# Patient Record
Sex: Female | Born: 2000 | Race: White | Hispanic: No | Smoking: Former smoker
Health system: Southern US, Community
[De-identification: ages and names within clinical notes are randomized; demographics above are authoritative.]

## PROBLEM LIST (undated history)

## (undated) DIAGNOSIS — R569 Unspecified convulsions: Secondary | ICD-10-CM

## (undated) DIAGNOSIS — J45909 Unspecified asthma, uncomplicated: Secondary | ICD-10-CM

## (undated) HISTORY — PX: WISDOM TOOTH EXTRACTION: SHX21

---
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
## 2018-04-25 DIAGNOSIS — Z68.41 Body mass index (BMI) pediatric, 85th percentile to less than 95th percentile for age: Secondary | ICD-10-CM | POA: Diagnosis not present

## 2018-04-25 DIAGNOSIS — Z0001 Encounter for general adult medical examination with abnormal findings: Secondary | ICD-10-CM | POA: Diagnosis not present

## 2018-04-25 DIAGNOSIS — Z713 Dietary counseling and surveillance: Secondary | ICD-10-CM | POA: Diagnosis not present

## 2018-05-09 ENCOUNTER — Ambulatory Visit: Payer: 59 | Admitting: Child and Adolescent Psychiatry

## 2018-05-09 ENCOUNTER — Encounter: Payer: Self-pay | Admitting: Child and Adolescent Psychiatry

## 2018-05-09 ENCOUNTER — Other Ambulatory Visit: Payer: Self-pay

## 2018-05-09 DIAGNOSIS — R102 Pelvic and perineal pain: Secondary | ICD-10-CM | POA: Insufficient documentation

## 2018-05-09 DIAGNOSIS — F32A Depression, unspecified: Secondary | ICD-10-CM

## 2018-05-09 DIAGNOSIS — F329 Major depressive disorder, single episode, unspecified: Secondary | ICD-10-CM

## 2018-05-09 DIAGNOSIS — F1910 Other psychoactive substance abuse, uncomplicated: Secondary | ICD-10-CM | POA: Diagnosis not present

## 2018-05-09 DIAGNOSIS — F1914 Other psychoactive substance abuse with psychoactive substance-induced mood disorder: Secondary | ICD-10-CM | POA: Diagnosis not present
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
## 2018-05-10 ENCOUNTER — Encounter: Payer: Self-pay | Admitting: Child and Adolescent Psychiatry

## 2018-05-10 DIAGNOSIS — F32A Depression, unspecified: Secondary | ICD-10-CM | POA: Insufficient documentation

## 2018-05-10 DIAGNOSIS — F1910 Other psychoactive substance abuse, uncomplicated: Secondary | ICD-10-CM | POA: Insufficient documentation

## 2018-05-10 DIAGNOSIS — F329 Major depressive disorder, single episode, unspecified: Secondary | ICD-10-CM | POA: Insufficient documentation

## 2018-05-10 DIAGNOSIS — F1914 Other psychoactive substance abuse with psychoactive substance-induced mood disorder: Secondary | ICD-10-CM | POA: Insufficient documentation

## 2018-05-10 NOTE — Progress Notes (Signed)
Rhonda Walsh is a 17 y.o. female referred for psychiatric consultation and displays the following risk factors for Suicide:  Demographic factors:  Adolescent or young adult Current Mental Status: No plan to harm self or others Loss Factors: Financial problems/change in socioeconomic status Historical Factors: Prior suicide attempts Risk Reduction Factors: Employed, Living with another person, especially a relative and Positive social support  CLINICAL FACTORS:  Depression:   Insomnia Alcohol/Substance Abuse/Dependencies  COGNITIVE FEATURES THAT CONTRIBUTE TO RISK: Closed-mindedness    SUICIDE RISK:  Rhonda Walsh currently denies any SI/HI and does not appear in imminent danger to self/others. Her hx of previous SI, suicide attempt, depression, cutting, extensive substance abuse likely puts her at a chronically elevated risk of self-harm/harm to others. She appears future oriented, does have a good social support from adoptive mother and sees ind therapist weekly which would likely reduce chronic risk of self harm or harm to others. She along with her mother are highly encouraged to make an appointment with local substance abuse treatment programs from the list of the local substance abuse treatment program provided to them. This will be most beneficial to reduce chronic risk of self harm/harm to others.    Mental Status: As mentioned in H&P from today's visit.   PLAN OF CARE: As mentioned in H&P from today's visit and above in suicide risk.     Darcel Smalling, MD 05/10/2018, 4:08 PM

## 2018-06-18 ENCOUNTER — Encounter: Payer: Self-pay | Admitting: Emergency Medicine

## 2018-06-18 ENCOUNTER — Emergency Department: Payer: 59

## 2018-06-18 ENCOUNTER — Other Ambulatory Visit: Payer: Self-pay

## 2018-06-18 ENCOUNTER — Emergency Department
Admission: EM | Admit: 2018-06-18 | Discharge: 2018-06-18 | Disposition: A | Discharge status: Home / Self Care | Payer: 59 | Attending: Emergency Medicine | Admitting: Emergency Medicine

## 2018-06-18 DIAGNOSIS — J069 Acute upper respiratory infection, unspecified: Secondary | ICD-10-CM

## 2018-06-18 DIAGNOSIS — R05 Cough: Secondary | ICD-10-CM | POA: Diagnosis not present

## 2018-06-18 DIAGNOSIS — F17210 Nicotine dependence, cigarettes, uncomplicated: Secondary | ICD-10-CM | POA: Diagnosis not present

## 2018-06-18 DIAGNOSIS — J45901 Unspecified asthma with (acute) exacerbation: Secondary | ICD-10-CM | POA: Insufficient documentation

## 2018-06-18 DIAGNOSIS — Z79899 Other long term (current) drug therapy: Secondary | ICD-10-CM | POA: Diagnosis not present

## 2018-06-18 DIAGNOSIS — J4520 Mild intermittent asthma, uncomplicated: Secondary | ICD-10-CM

## 2018-06-18 DIAGNOSIS — R0981 Nasal congestion: Secondary | ICD-10-CM | POA: Diagnosis not present

## 2018-06-18 LAB — INFLUENZA PANEL BY PCR (TYPE A & B)
INFLBPCR: NEGATIVE
Influenza A By PCR: NEGATIVE

## 2018-06-18 MED ORDER — PREDNISONE 10 MG PO TABS: ORAL_TABLET | ORAL | 0 refills | Status: DC

## 2018-06-18 MED ORDER — METHYLPREDNISOLONE SODIUM SUCC 125 MG IJ SOLR
125.0000 mg | Freq: Once | INTRAMUSCULAR | Status: AC
  Administered 2018-06-18: 125 mg via INTRAMUSCULAR
  Filled 2018-06-18: qty 2

## 2018-06-18 MED ORDER — BENZONATATE 100 MG PO CAPS: 100.0000 mg | ORAL_CAPSULE | Freq: Three times a day (TID) | ORAL | 0 refills | Status: DC | PRN

## 2018-06-18 MED ORDER — ALBUTEROL SULFATE HFA 108 (90 BASE) MCG/ACT IN AERS: 2.0000 | INHALATION_SPRAY | Freq: Four times a day (QID) | RESPIRATORY_TRACT | 0 refills | Status: AC | PRN

## 2018-06-18 MED ORDER — IPRATROPIUM-ALBUTEROL 0.5-2.5 (3) MG/3ML IN SOLN
3.0000 mL | Freq: Once | RESPIRATORY_TRACT | Status: AC
  Administered 2018-06-18: 3 mL via RESPIRATORY_TRACT
  Filled 2018-06-18: qty 3

## 2018-06-18 MED ORDER — PSEUDOEPH-BROMPHEN-DM 30-2-10 MG/5ML PO SYRP: 5.0000 mL | ORAL_SOLUTION | Freq: Four times a day (QID) | ORAL | 0 refills | Status: DC | PRN

## 2018-06-18 MED ORDER — AZITHROMYCIN 250 MG PO TABS: ORAL_TABLET | ORAL | 0 refills | Status: DC

## 2018-06-18 MED ORDER — ONDANSETRON HCL 4 MG PO TABS: 4.0000 mg | ORAL_TABLET | Freq: Every day | ORAL | 0 refills | Status: DC | PRN

## 2018-06-18 MED ORDER — ONDANSETRON 4 MG PO TBDP
4.0000 mg | ORAL_TABLET | Freq: Once | ORAL | Status: AC
  Administered 2018-06-18: 4 mg via ORAL
  Filled 2018-06-18: qty 1

## 2018-06-18 NOTE — ED Triage Notes (Signed)
Patient reports nasal congestion and cough on and off for the past week. Denies fever at home. Patient complaining of mild nausea and a "pinch" across her chest when she coughs.

## 2018-06-18 NOTE — ED Provider Notes (Signed)
[facility] Emergency Department Provider Note  ____________________________________________  Time seen: Approximately 1:51 PM  I have reviewed the triage vital signs and the nursing notes.   HISTORY  Chief Complaint Nasal Congestion and Cough    HPI Rhonda Walsh is a 17 y.o. female that presents emergency department for evaluation of nasal congestion, sore throat, cough, shortness of breath with coughing, nausea for 1 week. She states that she has an occasional pinch to her left breast with coughing, when she presses the area and with certain movements.  Patient had 2 episodes of watery diarrhea last night.  She has an IUD.  Patient has a history of asthma.  She smokes 1-2 cigarettes per day. She smokes mariajuana. Patient does heavy lifting for work and may have pulled a muscle to her chest.  No fevers, chills, vomiting.    Past Medical History:  Diagnosis Date  . Seizures [facility])     Patient Active Problem List   Diagnosis Date Noted  . Polysubstance abuse (HCC) 05/10/2018  . 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 Depressive disorder 05/10/2018  . Pelvic pain in female 05/09/2018    Past Surgical History:  Procedure Laterality Date  . WISDOM TOOTH EXTRACTION      Prior to Admission medications   Medication Sig Start Date End Date Taking? Authorizing Provider  albuterol (PROVENTIL HFA;VENTOLIN HFA) 108 (90 Base) MCG/ACT inhaler Inhale 2 puffs into the lungs every 6 (six) hours as needed for wheezing or shortness of breath. 06/18/18   Enid Derry, PA-C  azithromycin (ZITHROMAX Z-PAK) 250 MG tablet Take 2 tablets (500 mg) on  Day 1,  followed by 1 tablet (250 mg) once daily on Days 2 through 5. 06/18/18   Enid Derry, PA-C  benzonatate (TESSALON PERLES) 100 MG capsule Take 1 capsule (100 mg total) by mouth 3 (three) times daily as needed for cough. 06/18/18 06/18/19  Enid Derry, PA-C  brompheniramine-pseudoephedrine-DM 30-2-10 MG/5ML syrup Take 5 mLs by mouth 4 (four) times daily as needed. 06/18/18   Enid Derry, PA-C  ondansetron (ZOFRAN) 4 MG tablet Take 1 tablet (4 mg total) by mouth daily as needed for nausea or vomiting. 06/18/18 06/18/19  Enid Derry, PA-C  predniSONE (DELTASONE) 10 MG tablet Take 6 tablets on day 1, take 5 tablets on day 2, take 4 tablets on day 3, take 3 tablets on day 4, take 2 tablets on day 5, take 1 tablet on day 6 06/18/18   Enid Derry, PA-C    Allergies Patient has no known allergies.  Family History  Adopted: Yes    Social History Social History   Tobacco Use  . Smoking status: Current Every Day Smoker    Types: Cigarettes, E-cigarettes  . Smokeless tobacco: Never Used  Substance Use Topics  . Alcohol use: Yes    Alcohol/week: 5.0 standard drinks    Types: 5 Shots of liquor per week  . Drug use: Yes    Types: Cocaine, Marijuana    Comment: percocet/ moley, marijuana everyday     Review of Systems  Constitutional: No fever/chills Eyes: No visual changes. No discharge. ENT: Positive for congestion and rhinorrhea. Respiratory: Positive for cough and SOB. Gastrointestinal: No abdominal pain.  No vomiting.  No diarrhea.  No constipation. Musculoskeletal: Negative for musculoskeletal pain. Skin: Negative for rash, abrasions, lacerations, ecchymosis. Neurological: Negative for headaches.   ____________________________________________   PHYSICAL EXAM:  VITAL SIGNS: ED Triage Vitals  Enc Vitals  Group     BP 06/18/18 1301 137/66     Pulse Rate 06/18/18 1301 83     Resp 06/18/18 1301 18     Temp 06/18/18 1301 98.4 F (36.9 C)     Temp Source 06/18/18 1301 Oral     SpO2 06/18/18 1301 100 %     Weight 06/18/18 1302 162 lb (73.5 kg)     Height 06/18/18 1302 5\' 5"  (1.651 m)     Head Circumference --      Peak Flow --      Pain Score 06/18/18 1301 7     Pain Loc --      Pain Edu? --      Excl.  in GC? --      Constitutional: Alert and oriented. Well appearing and in no acute distress. Eyes: Conjunctivae are normal. PERRL. EOMI. No discharge. Head: Atraumatic. ENT: No frontal and maxillary sinus tenderness.      Ears: Tympanic membranes pearly gray with good landmarks. No discharge.      Nose: Moderate congestion/rhinnorhea.      Mouth/Throat: Mucous membranes are moist. Oropharynx non-erythematous. Tonsils not enlarged. No exudates. Uvula midline. Neck: No stridor.   Hematological/Lymphatic/Immunilogical: No cervical lymphadenopathy. Cardiovascular: Normal rate, regular rhythm.  Good peripheral circulation. Respiratory: Normal respiratory effort without tachypnea or retractions. Lungs CTAB. Good air entry to the bases with no decreased or absent breath sounds. Gastrointestinal: Bowel sounds 4 quadrants. Soft and nontender to palpation. No guarding or rigidity. No palpable masses. No distention. Musculoskeletal: Full range of motion to all extremities. No gross deformities appreciated. Mild tenderness to palpation to medial left breast. Neurologic:  Normal speech and language. No gross focal neurologic deficits are appreciated.  Skin:  Skin is warm, dry and intact. No rash noted. Psychiatric: Mood and affect are normal. Speech and behavior are normal. Patient exhibits appropriate insight and judgement.   ____________________________________________   LABS (all labs ordered are listed, but only abnormal results are displayed)  Labs Reviewed  INFLUENZA PANEL BY PCR (TYPE A & B)   ____________________________________________  EKG  SR ____________________________________________  RADIOLOGY Lexine BatonI, Maurion Walkowiak, personally viewed and evaluated these images (plain radiographs) as part of my medical decision making, as well as reviewing the written report by the radiologist.  Dg Chest 2 View  Result Date: 06/18/2018 CLINICAL DATA:  nasal congestion, SOB, and dry cough on and  off for the past week. Denies fever at home. Patient complaining of mild nausea and a "pinch" across her chest when she coughs. No known heart or lung problems. Occasional smoker- reports 1-2 cigarettes per day. EXAM: CHEST - 2 VIEW COMPARISON:  01/18/2014 FINDINGS: Lungs are clear. Heart size and mediastinal contours are within normal limits. No effusion.  No pneumothorax. Visualized bones unremarkable. IMPRESSION: No acute cardiopulmonary disease. Electronically Signed   By: Corlis Leak  Hassell M.D.   On: 06/18/2018 14:17    ____________________________________________    PROCEDURES  Procedure(s) performed:    Procedures    Medications  methylPREDNISolone sodium succinate (SOLU-MEDROL) 125 mg/2 mL injection 125 mg (125 mg Intramuscular Given 06/18/18 1531)  ipratropium-albuterol (DUONEB) 0.5-2.5 (3) MG/3ML nebulizer solution 3 mL (3 mLs Nebulization Given 06/18/18 1530)  ondansetron (ZOFRAN-ODT) disintegrating tablet 4 mg (4 mg Oral Given 06/18/18 1529)     ____________________________________________   INITIAL IMPRESSION / ASSESSMENT AND PLAN / ED COURSE  Pertinent labs & imaging results that were available during my care of the patient were reviewed by me and considered in my  medical decision making (see chart for details).  Review of the [place] CSRS was performed in accordance of the [facility] prior to dispensing any controlled drugs.   Patient's diagnosis is consistent with URI with cough and congestion. Vital signs and exam are reassuring.  Chest x-ray negative for consolidation.  Influenza test is negative but patient has had symptoms for more than 48 hours so may be inaccurate.  Symptoms have been present for 1 week.  Patient appears well and is staying well hydrated.  Patient feels comfortable going home. Patient will be discharged home with prescriptions for azithromycin, prednisone, Tessalon Perles, Bromfed, albuterol inhaler, Zofran. Patient is to follow up with primary care as needed or  otherwise directed. Patient is given ED precautions to return to the ED for any worsening or new symptoms.     ____________________________________________  FINAL CLINICAL IMPRESSION(S) / ED DIAGNOSES  Final diagnoses:  Intermittent asthma, unspecified asthma severity, unspecified whether complicated  URI with cough and congestion      NEW MEDICATIONS STARTED DURING THIS VISIT:  ED Discharge Orders         Ordered    brompheniramine-pseudoephedrine-DM 30-2-10 MG/5ML syrup  4 times daily PRN     06/18/18 1533    benzonatate (TESSALON PERLES) 100 MG capsule  3 times daily PRN     06/18/18 1533    albuterol (PROVENTIL HFA;VENTOLIN HFA) 108 (90 Base) MCG/ACT inhaler  Every 6 hours PRN     06/18/18 1533    predniSONE (DELTASONE) 10 MG tablet     06/18/18 1533    azithromycin (ZITHROMAX Z-PAK) 250 MG tablet     06/18/18 1636    ondansetron (ZOFRAN) 4 MG tablet  Daily PRN     06/18/18 1651              This chart was dictated using voice recognition software/Dragon. Despite best efforts to proofread, errors can occur which can change the meaning. Any change was purely unintentional.    Enid Derry, PA-C 06/18/18 1830    Emily Filbert, MD 06/21/18 1525

## 2018-07-10 ENCOUNTER — Other Ambulatory Visit: Payer: Self-pay

## 2018-07-10 ENCOUNTER — Encounter: Payer: Self-pay | Admitting: Emergency Medicine

## 2018-07-10 ENCOUNTER — Emergency Department
Admission: EM | Admit: 2018-07-10 | Discharge: 2018-07-10 | Disposition: A | Discharge status: Home / Self Care | Payer: 59 | Attending: Emergency Medicine | Admitting: Emergency Medicine

## 2018-07-10 ENCOUNTER — Emergency Department: Payer: 59

## 2018-07-10 DIAGNOSIS — Y929 Unspecified place or not applicable: Secondary | ICD-10-CM | POA: Insufficient documentation

## 2018-07-10 DIAGNOSIS — Y990 Civilian activity done for income or pay: Secondary | ICD-10-CM | POA: Insufficient documentation

## 2018-07-10 DIAGNOSIS — S99912A Unspecified injury of left ankle, initial encounter: Secondary | ICD-10-CM | POA: Diagnosis present

## 2018-07-10 DIAGNOSIS — Y9389 Activity, other specified: Secondary | ICD-10-CM | POA: Insufficient documentation

## 2018-07-10 DIAGNOSIS — W010XXA Fall on same level from slipping, tripping and stumbling without subsequent striking against object, initial encounter: Secondary | ICD-10-CM | POA: Insufficient documentation

## 2018-07-10 DIAGNOSIS — M7989 Other specified soft tissue disorders: Secondary | ICD-10-CM | POA: Diagnosis not present

## 2018-07-10 DIAGNOSIS — S93492A Sprain of other ligament of left ankle, initial encounter: Secondary | ICD-10-CM | POA: Diagnosis not present

## 2018-07-10 DIAGNOSIS — F329 Major depressive disorder, single episode, unspecified: Secondary | ICD-10-CM | POA: Insufficient documentation

## 2018-07-10 DIAGNOSIS — Z87891 Personal history of nicotine dependence: Secondary | ICD-10-CM | POA: Diagnosis not present

## 2018-07-10 DIAGNOSIS — M25572 Pain in left ankle and joints of left foot: Secondary | ICD-10-CM | POA: Diagnosis not present

## 2018-07-10 MED ORDER — NAPROXEN 500 MG PO TABS: 500.0000 mg | ORAL_TABLET | Freq: Two times a day (BID) | ORAL | 0 refills | Status: AC

## 2018-07-10 MED ORDER — NAPROXEN 500 MG PO TABS
500.0000 mg | ORAL_TABLET | Freq: Once | ORAL | Status: AC
  Administered 2018-07-10: 500 mg via ORAL
  Filled 2018-07-10: qty 1

## 2018-07-10 NOTE — ED Notes (Signed)
Pt has left ankle pain.  Pt twisted ankle today at work.   Pt did not fall   Swelling to ankle noted.   Pt alert.

## 2018-07-10 NOTE — Discharge Instructions (Signed)
Rest, ice, and elevate your foot as often as possible.  Follow up with the podiatrist if not improving over the next few days.  Return to the ER for symptoms that change or worsen if unable to schedule an appointment.

## 2018-07-10 NOTE — ED Provider Notes (Signed)
[facility] Emergency Department Provider Note ____________________________________________  Time seen: Approximately 8:14 PM  I have reviewed the triage vital signs and the nursing notes.   HISTORY  Chief Complaint Ankle Pain    HPI Art BuffKendall R Rodd is a 18 y.o. female who presents to the emergency department for evaluation and treatment of left ankle pain.  While at work today, she tripped over some boxes and twisted her ankle. Incident occurred at about 2:00PM, but she continued to work. Once she was off, she took her boot off and the pain intensified. She has not taken anything or attempted any alleviating measures prior to arrival.   Past Medical History:  Diagnosis Date  . Seizures [facility](HCC)     Patient Active Problem List   Diagnosis Date Noted  . Polysubstance abuse (HCC) 05/10/2018  . 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 Depressive disorder 05/10/2018  . Pelvic pain in female 05/09/2018    Past Surgical History:  Procedure Laterality Date  . WISDOM TOOTH EXTRACTION      Prior to Admission medications   Medication Sig Start Date End Date Taking? Authorizing Provider  albuterol (PROVENTIL HFA;VENTOLIN HFA) 108 (90 Base) MCG/ACT inhaler Inhale 2 puffs into the lungs every 6 (six) hours as needed for wheezing or shortness of breath. 06/18/18   Enid DerryWagner, Ashley, PA-C  azithromycin (ZITHROMAX Z-PAK) 250 MG tablet Take 2 tablets (500 mg) on  Day 1,  followed by 1 tablet (250 mg) once daily on Days 2 through 5. 06/18/18   Enid DerryWagner, Ashley, PA-C  benzonatate (TESSALON PERLES) 100 MG capsule Take 1 capsule (100 mg total) by mouth 3 (three) times daily as needed for cough. 06/18/18 06/18/19  Enid DerryWagner, Ashley, PA-C  brompheniramine-pseudoephedrine-DM 30-2-10 MG/5ML syrup Take 5 mLs by mouth 4 (four) times daily as needed. 06/18/18   Enid DerryWagner, Ashley, PA-C  naproxen (NAPROSYN) 500 MG tablet Take 1  tablet (500 mg total) by mouth 2 (two) times daily with a meal. 07/10/18   Kamaiya Antilla B, FNP  ondansetron (ZOFRAN) 4 MG tablet Take 1 tablet (4 mg total) by mouth daily as needed for nausea or vomiting. 06/18/18 06/18/19  Enid DerryWagner, Ashley, PA-C  predniSONE (DELTASONE) 10 MG tablet Take 6 tablets on day 1, take 5 tablets on day 2, take 4 tablets on day 3, take 3 tablets on day 4, take 2 tablets on day 5, take 1 tablet on day 6 06/18/18   Enid DerryWagner, Ashley, PA-C    Allergies Patient has no known allergies.  Family History  Adopted: Yes    Social History Social History   Tobacco Use  . Smoking status: Former Smoker    Types: Cigarettes, E-cigarettes    Last attempt to quit: 04/01/2018    Years since quitting: 0.2  . Smokeless tobacco: Never Used  Substance Use Topics  . Alcohol use: Yes    Alcohol/week: 5.0 standard drinks    Types: 5 Shots of liquor per week  . Drug use: Yes    Types: Cocaine, Marijuana    Comment: percocet/ moley, marijuana everyday    Review of Systems Constitutional: Negative for fever. Cardiovascular: Negative for chest pain. Respiratory: Negative for shortness of breath. Musculoskeletal: Positive for left ankle pain and swelling Skin: Negative for open wound or lesion.  Neurological: Negative for decrease in sensation  ____________________________________________   PHYSICAL EXAM:  VITAL SIGNS: ED Triage Vitals [07/10/18 1906]  Enc Vitals Group     BP 128/66  Pulse Rate 82     Resp 14     Temp 98.7 F (37.1 C)     Temp Source Oral     SpO2 100 %     Weight 150 lb (68 kg)     Height 5\' 5"  (1.651 m)     Head Circumference      Peak Flow      Pain Score 10     Pain Loc      Pain Edu?      Excl. in GC?     Constitutional: Alert and oriented. Well appearing and in no acute distress. Eyes: Conjunctivae are clear without discharge or drainage Head: Atraumatic Neck: Supple Respiratory: No cough. Respirations are even and  unlabored. Musculoskeletal: ATFL pattern tenderness over the left ankle.  No tenderness over the proximal fibula.  Limited flexion, extension, inversion, and eversion secondary to pain and swelling. Neurologic: Motor and sensory function is intact. Skin: Moderate edema over the left lateral malleolus without deformity.  Skin is warm and dry.  Pulses 2+ DP and PT Psychiatric: Affect and behavior are appropriate.  ____________________________________________   LABS (all labs ordered are listed, but only abnormal results are displayed)  Labs Reviewed - No data to display ____________________________________________  RADIOLOGY  Image of the left ankle reviewed by me.  No acute bony abnormality. ____________________________________________   PROCEDURES  .Splint Application Date/Time: 07/10/2018 8:27 PM Performed by: Ainsley Spinner, NT Authorized by: Chinita Pester, FNP   Consent:    Consent obtained:  Verbal   Consent given by:  Patient   Risks discussed:  Discoloration, pain and swelling Pre-procedure details:    Sensation:  Normal Procedure details:    Laterality:  Left   Location:  Ankle   Splint type:  Ankle stirrup   Supplies:  Prefabricated splint Post-procedure details:    Pain:  Improved   Sensation:  Normal   Patient tolerance of procedure:  Tolerated well, no immediate complications  ____________________________________________   INITIAL IMPRESSION / ASSESSMENT AND PLAN / ED COURSE  DELMIRA GRIFFITHS is a 18 y.o. who presents to the emergency department for treatment evaluation of left ankle pain after an inversion injury while at work.  X-ray and exam are consistent with ankle sprain.  She was placed in an ankle stirrup splint and given crutches. She was advised to discuss filing worker's compensation with her supervisor.   Patient instructed to follow-up with podiatry if not improving over the week.  She was also instructed to return to the emergency  department for symptoms that change or worsen if unable schedule an appointment with orthopedics or primary care.  Medications  naproxen (NAPROSYN) tablet 500 mg (has no administration in time range)    Pertinent labs & imaging results that were available during my care of the patient were reviewed by me and considered in my medical decision making (see chart for details).  _________________________________________   FINAL CLINICAL IMPRESSION(S) / ED DIAGNOSES  Final diagnoses:  Sprain of anterior talofibular ligament of left ankle, initial encounter    ED Discharge Orders         Ordered    naproxen (NAPROSYN) 500 MG tablet  2 times daily with meals     07/10/18 2047           If controlled substance prescribed during this visit, 12 month history viewed on the [facility] prior to issuing an initial prescription for Schedule II or III opiod.    Malin Sambrano B,  FNP 07/10/18 2120    Minna AntisPaduchowski, Kevin, MD 07/10/18 2315

## 2018-07-10 NOTE — ED Triage Notes (Signed)
Pt presents to ED with c/o left ankle pain. Pt states she works for [employer] and tripped over boxes. Swelling noted. Pt reports she is unable to make this a workmans comp case due to not having the job long enough to qualify. +painful with ambulation and touch.

## 2018-11-29 ENCOUNTER — Other Ambulatory Visit: Payer: Self-pay | Admitting: Obstetrics & Gynecology

## 2019-02-09 ENCOUNTER — Emergency Department
Admission: EM | Admit: 2019-02-09 | Discharge: 2019-02-09 | Disposition: A | Discharge status: Home / Self Care | Payer: 59 | Attending: Emergency Medicine | Admitting: Emergency Medicine

## 2019-02-09 ENCOUNTER — Other Ambulatory Visit: Payer: Self-pay

## 2019-02-09 ENCOUNTER — Encounter: Payer: Self-pay | Admitting: Emergency Medicine

## 2019-02-09 DIAGNOSIS — U071 COVID-19: Secondary | ICD-10-CM | POA: Insufficient documentation

## 2019-02-09 DIAGNOSIS — Z036 Encounter for observation for suspected toxic effect from ingested substance ruled out: Secondary | ICD-10-CM | POA: Diagnosis present

## 2019-02-09 DIAGNOSIS — F1914 Other psychoactive substance abuse with psychoactive substance-induced mood disorder: Secondary | ICD-10-CM | POA: Diagnosis not present

## 2019-02-09 DIAGNOSIS — Z046 Encounter for general psychiatric examination, requested by authority: Secondary | ICD-10-CM | POA: Diagnosis not present

## 2019-02-09 DIAGNOSIS — J45909 Unspecified asthma, uncomplicated: Secondary | ICD-10-CM | POA: Diagnosis not present

## 2019-02-09 DIAGNOSIS — Z87891 Personal history of nicotine dependence: Secondary | ICD-10-CM | POA: Diagnosis not present

## 2019-02-09 DIAGNOSIS — F419 Anxiety disorder, unspecified: Secondary | ICD-10-CM | POA: Insufficient documentation

## 2019-02-09 DIAGNOSIS — R451 Restlessness and agitation: Secondary | ICD-10-CM | POA: Insufficient documentation

## 2019-02-09 HISTORY — DX: Unspecified asthma, uncomplicated: J45.909

## 2019-02-09 LAB — CBC
HCT: 45.6 % (ref 36.0–46.0)
Hemoglobin: 15.3 g/dL — ABNORMAL HIGH (ref 12.0–15.0)
MCH: 31 pg (ref 26.0–34.0)
MCHC: 33.6 g/dL (ref 30.0–36.0)
MCV: 92.5 fL (ref 80.0–100.0)
Platelets: 296 10*3/uL (ref 150–400)
RBC: 4.93 MIL/uL (ref 3.87–5.11)
RDW: 14.8 % (ref 11.5–15.5)
WBC: 8.7 10*3/uL (ref 4.0–10.5)
nRBC: 0 % (ref 0.0–0.2)

## 2019-02-09 LAB — COMPREHENSIVE METABOLIC PANEL
ALT: 21 U/L (ref 0–44)
AST: 41 U/L (ref 15–41)
Albumin: 5 g/dL (ref 3.5–5.0)
Alkaline Phosphatase: 71 U/L (ref 38–126)
Anion gap: 13 (ref 5–15)
BUN: 7 mg/dL (ref 6–20)
CO2: 23 mmol/L (ref 22–32)
Calcium: 10.2 mg/dL (ref 8.9–10.3)
Chloride: 100 mmol/L (ref 98–111)
Creatinine, Ser: 0.73 mg/dL (ref 0.44–1.00)
GFR calc Af Amer: >60 mL/min (ref >60)
GFR calc non Af Amer: >60 mL/min (ref >60)
Glucose, Bld: 103 mg/dL — ABNORMAL HIGH (ref 70–99)
Potassium: 4 mmol/L (ref 3.5–5.1)
Sodium: 136 mmol/L (ref 135–145)
Total Bilirubin: 1.3 mg/dL — ABNORMAL HIGH (ref 0.3–1.2)
Total Protein: 9.1 g/dL — ABNORMAL HIGH (ref 6.5–8.1)

## 2019-02-09 LAB — URINE DRUG SCREEN, QUALITATIVE (ARMC ONLY)
Amphetamines, Ur Screen: POSITIVE — AB
Barbiturates, Ur Screen: NOT DETECTED
Benzodiazepine, Ur Scrn: NOT DETECTED
Cannabinoid 50 Ng, Ur ~~LOC~~: POSITIVE — AB
Cocaine Metabolite,Ur ~~LOC~~: POSITIVE — AB
MDMA (Ecstasy)Ur Screen: NOT DETECTED
Methadone Scn, Ur: NOT DETECTED
Opiate, Ur Screen: NOT DETECTED
Phencyclidine (PCP) Ur S: NOT DETECTED
Tricyclic, Ur Screen: NOT DETECTED

## 2019-02-09 LAB — ETHANOL: Alcohol, Ethyl (B): <10 mg/dL (ref <10)

## 2019-02-09 LAB — PREGNANCY, URINE: Preg Test, Ur: NEGATIVE

## 2019-02-09 LAB — SARS CORONAVIRUS 2 BY RT PCR (HOSPITAL ORDER, PERFORMED IN ~~LOC~~ HOSPITAL LAB): SARS Coronavirus 2: POSITIVE — AB

## 2019-02-09 MED ORDER — HALOPERIDOL LACTATE 5 MG/ML IJ SOLN
5.0000 mg | Freq: Once | INTRAMUSCULAR | Status: AC
  Administered 2019-02-09: 5 mg via INTRAMUSCULAR
  Filled 2019-02-09: qty 1

## 2019-02-09 MED ORDER — NICOTINE 21 MG/24HR TD PT24
21.0000 mg | MEDICATED_PATCH | Freq: Once | TRANSDERMAL | Status: DC
  Administered 2019-02-09: 21 mg via TRANSDERMAL
  Filled 2019-02-09: qty 1

## 2019-02-09 MED ORDER — GABAPENTIN 300 MG PO CAPS: 300.0000 mg | ORAL_CAPSULE | Freq: Three times a day (TID) | ORAL | 2 refills | Status: AC

## 2019-02-09 MED ORDER — GABAPENTIN 300 MG PO CAPS: 300.0000 mg | ORAL_CAPSULE | Freq: Three times a day (TID) | ORAL | 1 refills | Status: DC

## 2019-02-09 MED ORDER — DIAZEPAM 5 MG PO TABS
10.0000 mg | ORAL_TABLET | Freq: Once | ORAL | Status: AC
  Administered 2019-02-09: 10 mg via ORAL
  Filled 2019-02-09: qty 2

## 2019-02-09 MED ORDER — GABAPENTIN 300 MG PO CAPS
300.0000 mg | ORAL_CAPSULE | Freq: Three times a day (TID) | ORAL | Status: DC
  Administered 2019-02-09: 15:00:00 300 mg via ORAL
  Filled 2019-02-09: qty 1

## 2019-02-09 NOTE — ED Notes (Signed)
Pt walking around in room, crying, states " I don't feel safe, they are trying to hurt me" PT refers to " theres people in this room" RN gave verbal reassurance of pt safety. NT called to sit with pt

## 2019-02-09 NOTE — Progress Notes (Signed)
Patient too groggy to assess related to PRN medications on admission.  Waylan Boga, PMHNP

## 2019-02-09 NOTE — Discharge Instructions (Signed)
[facility] & [facility] 1.8 33 [employer] reviews Mental health service [place] Address: [address], [place], [place] [address] Hours:  8 am to 5 pm, Monday - Friday Phone: [phone]  Substance Use Disorder and Mental Illness Substance use disorder is a condition in which a person is dependent on a substance, such as drugs or alcohol. A mental illness is a condition that occurs when someone experiences changes in mood, behavior, or thinking. Sometimes, these two conditions can occur at the same time (co-occurring disorders) and may be diagnosed together (dual diagnosis). What is the relationship between substance use disorder and mental illness? Substance use disorder and mental illness can share symptoms and can have similar causes, such as exposure to stress or changes in brain chemicals. The risk for developing both of these conditions can be passed from parent to child (inherited). People with mental illnesses sometimes use drugs to try and relieve symptoms, and this can lead to substance use disorder. Substance use disorders occur more often in people who have depression, schizophrenia, anxiety, or personality disorders. When some drugs are used regularly, they can cause people to have symptoms of mental illness. What are the signs or symptoms? Symptoms vary widely for substance use disorder and mental illness, especially because these conditions can be present at the same time. Signs of a substance use disorder may include:  Failure to meet responsibilities at home, work, or school.  Using substances in risky situation, such as while driving or using machinery.  Taking serious risks to get drugs or alcohol.  Spending less time on activities or hobbies that used to be important.  Changes in personality or attitude for no reason, such as angry  outbursts, symptoms of anxiety, or unusual giddiness.  Trying to hide the amount of drugs or alcohol used.  Increased substance use over time, or needing to use more of a substance to feel the same effects (developing a tolerance).  Sudden weight loss or gain.  Sleeping too much or too little.  Uncontrolled trembling or shaking (tremors), slurred speech, or lack of coordination.  Continuing to use alcohol or a drug even though using it has led to bad outcomes or consequences, such as losing a job or ending a relationship. Signs of mental illness may include:  Extreme mood changes (mood swings).  Sleeping too much or too little.  Weight loss or weight gain.  Being easily distracted.  Confused thinking or trouble concentrating.  Expressing thoughts of suicide.  Withdrawing from friends and family, or sudden changes in social behaviors or hobbies.  Aggression toward people and animals.  Repeatedly breaking serious rules or breaking the law.  Having persistent thoughts or urges that are unpleasant or feel out of control (involuntary). The person may feel the need to act on the urges in order to reduce anxiety.  Persistent feelings of sadness or hopelessness.  False beliefs (delusions).  Seeing, hearing, tasting, smelling, or feeling things that are not real (hallucinations). How is this diagnosed? Substance use disorder and mental illnesses can be difficult to diagnose at the same time because of how varied and complex the symptoms are. Because these conditions can interact, one condition may be missed. This is why it is important to be completely honest with your health care provider about substance use and your other symptoms. Diagnosing your condition may include:  A physical exam.  A review  of your medical history and your symptoms. Your health care provider may refer you to a mental health professional for a psychiatric evaluation. This may include assessments  of:  Your use of substances.  Your risk of suicide.  Your risk of aggressive behaviors.  Your lifestyle, environment, and social situations.  Your medical health.  Your mental health and behavioral history. How is this treated? It is best to treat substance use disorder and mental illness at the same time (integrated treatment approach). Treatment usually involves more than one of the following methods:  Detox. This refers to stopping substance abuse while being monitored by trained medical staff. This is usually the first step in treatment. Detox can last for up to 7 days.  Rehabilitation. This involves staying in a treatment center where you can have medical and mental health support all the time.  Medicines to relieve symptoms of mental illness and to control symptoms that are caused by stopping substance abuse (withdrawal symptoms).  Support groups. These groups encourage you to talk about your fears, frustrations, and anxieties with others who have the same condition.  Talk therapy. This is one-on-one therapy that can help you learn about your illness and learn ways to cope with symptoms or side effects. Follow these instructions at home: Lifestyle  Exercise regularly. Aim for 150 minutes of moderate exercise (such as walking or biking) or 75 minutes of vigorous exercise (such as running) each week.  Eat a healthy diet with plenty of fruits and vegetables, whole grains, and lean proteins.  Avoid caffeine and tobacco. These can worsen symptoms and anxiety.  Do not drink alcohol or use drugs.  Try to get 7-9 hours of sleep each night. To do this: ? Keep your bedroom cool and dark. ? Do not eat a heavy meal during the hour before you go to bed. ? Do not have caffeine before bedtime. ? Avoid screen time during the few hours before bedtime. This means not watching TV and not using a computer, cell phone, or tablet. Medicines  Take over-the-counter and prescription medicines  only as told by your health care provider.  Do not stop taking medicines unless you ask your health care provider if it is safe to do that.  Tell your health care provider about any medicine side effects that you experience. General instructions  Follow your treatment plan as directed. Work with your health care provider to adjust your treatment plan as needed.  Attend support group or therapy sessions as directed.  Explain your diagnosis to your friends and family. Let them know what your symptoms are and what things cause your symptoms to start (triggers).  Avoid triggers or stressors that may worsen your symptoms or cause you to use alcohol or drugs. Spend time with family and friends who do not use substances.  Make time to relax and do self-soothing activities, such as meditating or listening to music.  Keep all follow-up visits as told by your health care provider and therapist. This is important. Where to find support You may find support for coping with substance use disorder and mental illness from:  Your health care providers or your therapist. These providers can treat you or they can help you find services to treat your condition.  Local support groups for people with your condition. Your health care provider or therapist may be able to recommend a support group. This may be a hospital support group, a [employer] on Mental Illness (NAMI) support group, or a 12-step  group such as Alcoholics Anonymous (AA) or Narcotics Anonymous (NA).  Family and friends. Let them know what they can do to best support you through your recovery process. Where to find more information You may find more information about substance use disorder and mental illness from:  Substance Abuse and [facility]): ? Online: ktimeonline.com ? [employer], to talk with a person who can help you find information about treatment services in your area: 415-053-5154  (714)130-4625)  U.S. Department of Health and Financial controller mental health services: http://greene.com/  National Alliance on Mental Illness (NAMI): www.nami.[place] on Alcoholism and Drug Dependence: www.ncadd.org Contact a health care provider if:  Your symptoms get worse or they do not get better.  You have negative side effects from taking medicines.  You want to discuss stopping medicines or treatment.  You start using a substance again (have a relapse). Get help right away if:  You have thoughts about harming yourself or others. If you ever feel like you may hurt yourself or others, or have thoughts about taking your own life, get help right away. You can go to your nearest emergency department or call:  Your local emergency services (911 in the [place]  A suicide crisis helpline, such as the [place] at (850) 853-0769. This is open 24 hours a day. Summary  Substance use disorder and mental illness can occur at the same time (co-occurring disorders), and they may be diagnosed together (dual diagnosis).  These conditions can be difficult to diagnose at the same time. It is important to be completely honest with your health care provider about your substance use and your other symptoms.  It is best to treat substance use disorder and mental illness at the same time (integrated treatment approach).  Identifying new ways to deal with triggers and difficult situations is an important part of recovery.  Keep all follow-up visits as told by your health care provider and therapist. Be consistent when attending support groups or treatment programs. This information is not intended to replace advice given to you by your health care provider. Make sure you discuss any questions you have with your health care provider. Document Released: 11/02/2016 Document Revised: 08/14/2018 Document Reviewed: 11/02/2016 Elsevier Patient Education  [employer].

## 2019-02-09 NOTE — ED Notes (Signed)
Pt given crayon and piece of paper per request.

## 2019-02-09 NOTE — ED Notes (Signed)
Pt given landline to call work and let them know she would not be there.  Pt given bath items to freshen up and brush teeth.   Lm edt

## 2019-02-09 NOTE — ED Notes (Signed)
Cell phone placed back into belongings bag by Mickel Baas, EDT

## 2019-02-09 NOTE — ED Notes (Signed)
Pt says she tested COVID positive in mid-June and was last symptomatic around July 2. Pt given and advised to wear surgical mask; room door kept shut. Pt denies symptoms. Will continue to monitor for needs/safety.

## 2019-02-09 NOTE — ED Notes (Addendum)
Pt dressed out per this RN and EDT, Kayla.  Belongings placed in labeled bag and handed off to quad RN, [place].  Belongings include:  Land Sports Bra Underwear Sunglasses [place]

## 2019-02-09 NOTE — ED Notes (Signed)
Pt sitting up and eating, NAD noted

## 2019-02-09 NOTE — ED Provider Notes (Signed)
Surprisingly the patient is COVID-19 positive.  Unclear if we can admit the patient here versus transfer to another facility.   Earleen Newport, MD 02/09/19 [phone]

## 2019-02-09 NOTE — Consult Note (Signed)
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  Recommended substance abuse treatment, patient declined, "I don't have a problem."  Minimizes her drug use.  Patient seen in person face-to-face.  Client presented with physical pain related to drugs.  Later, she started having paranoia that people were out to get her.  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  Positive for meth, cocaine, and cannabis.  EDP ordered Haldol and Valium, patient slept.  When she awakened, she was clear and coherent.  No paranoia or hallucinations.  Denies suicidal/homicidal ideations, slight headache.  No safety concerns about discharging or feeling unsafe.  Lives with her mother and brother.  Stable psychiatrically.  HPI:  PER MD ED:   Rhonda Walsh is a 18 y.o. female with a history of asthma, seizures, substance abuse, depression who presents to the ED for possible ingestion.  Patient states she just started practicing witchcraft and she participated in the ceremony today.  She is not sure what they gave her, feels bad currently.  Pain is 9 out of 10.    Total Time spent with patient: 1 hour  Past Psychiatric History: polysubstance abuse  Past Medical History:  Past Medical History:  Diagnosis Date  . Asthma   . Seizures ([place])     Past Surgical History:  Procedure Laterality Date  . WISDOM TOOTH EXTRACTION     Family History:  Family History  Adopted: Yes   Family Psychiatric  History: none Social History:  Social History   Substance and Sexual Activity   Alcohol Use Yes  . Alcohol/week: 5.0 standard drinks  . Types: 5 Shots of liquor per week     Social History   Substance and Sexual Activity  Drug Use Yes  . Types: Cocaine, Marijuana   Comment: percocet/ moley, marijuana everyday    Social History   Socioeconomic History  . Marital status: Significant Other    Spouse name: Not on file  . Number of children: 0  . Years of education: Not on file  . Highest education level: High school graduate  Occupational History  . Occupation: ups    Comment: fulltime   Social Needs  . Financial resource strain: Not hard at all  . Food insecurity    Worry: Never true    Inability: Never true  . Transportation needs    Medical: No    Non-medical: No  Tobacco Use  . Smoking status: Former Smoker    Types: Cigarettes, E-cigarettes    Quit date: 04/01/2018    Years since quitting: 0.8  . Smokeless tobacco: Never Used  Substance and Sexual Activity  . Alcohol use: Yes    Alcohol/week: 5.0 standard drinks    Types: 5 Shots of liquor per week  . Drug use: Yes    Types: Cocaine, Marijuana    Comment: percocet/ moley, marijuana everyday  . Sexual activity: Yes    Birth control/protection: None, I.U.D.  Lifestyle  . Physical activity    Days per week: 5 days    Minutes per session: 120 min  . Stress: Very much  Relationships  . Social connections    Talks on phone: Not on file  Gets together: Not on file    Attends religious service: Never    Active member of club or organization: No    Attends meetings of clubs or organizations: Never    Relationship status: Living with partner  Other Topics Concern  . Not on file  Social History Narrative  . Not on file    Has this patient used any form of tobacco in the last 30 days? (Cigarettes, Smokeless Tobacco, Cigars, and/or Pipes) NA  Current Medications: Current Facility-Administered Medications  Medication Dose Route Frequency Provider Last Rate Last Dose  . gabapentin  (NEURONTIN) capsule 300 mg  300 mg Oral TID Charm RingsLord, [place] Brogden Y, NP   300 mg at 02/09/19 1519  . nicotine (NICODERM CQ - dosed in mg/24 hours) patch 21 mg  21 mg Transdermal Once Charm RingsLord, Masato Pettie Y, NP   21 mg at 02/09/19 1519   Current Outpatient Medications  Medication Sig Dispense Refill  . albuterol (PROVENTIL HFA;VENTOLIN HFA) 108 (90 Base) MCG/ACT inhaler Inhale 2 puffs into the lungs every 6 (six) hours as needed for wheezing or shortness of breath. 1 Inhaler 0  . azithromycin (ZITHROMAX Z-PAK) 250 MG tablet Take 2 tablets (500 mg) on  Day 1,  followed by 1 tablet (250 mg) once daily on Days 2 through 5. 6 each 0  . benzonatate (TESSALON PERLES) 100 MG capsule Take 1 capsule (100 mg total) by mouth 3 (three) times daily as needed for cough. 30 capsule 0  . brompheniramine-pseudoephedrine-DM 30-2-10 MG/5ML syrup Take 5 mLs by mouth 4 (four) times daily as needed. 120 mL 0  . naproxen (NAPROSYN) 500 MG tablet Take 1 tablet (500 mg total) by mouth 2 (two) times daily with a meal. 30 tablet 0  . ondansetron (ZOFRAN) 4 MG tablet Take 1 tablet (4 mg total) by mouth daily as needed for nausea or vomiting. 8 tablet 0  . predniSONE (DELTASONE) 10 MG tablet Take 6 tablets on day 1, take 5 tablets on day 2, take 4 tablets on day 3, take 3 tablets on day 4, take 2 tablets on day 5, take 1 tablet on day 6 21 tablet 0   PTA Medications: (Not in a hospital admission)   Musculoskeletal: Strength & Muscle Tone: within normal limits Gait & Station: normal Patient leans: N/A  Psychiatric Specialty Exam: Physical Exam  Nursing note and vitals reviewed. Constitutional: She is oriented to person, place, and time. She appears well-developed and well-nourished.  HENT:  Head: Normocephalic.  Neck: Normal range of motion.  Respiratory: Effort normal.  Musculoskeletal: Normal range of motion.  Neurological: She is alert and oriented to person, place, and time.  Psychiatric: Her speech is normal and behavior is  normal. Judgment and thought content normal. Her mood appears anxious. Her affect is blunt. Cognition and memory are normal.    Review of Systems  Neurological: Positive for headaches.  Psychiatric/Behavioral: Positive for substance abuse. The patient is nervous/anxious.   All other systems reviewed and are negative.   Blood pressure (!) 143/92, pulse 99, temperature 99 F (37.2 C), temperature source Oral, resp. rate 20, SpO2 100 %.There is no height or weight on file to calculate BMI.  General Appearance: Casual  Eye Contact:  Good  Speech:  Normal Rate  Volume:  Normal  Mood:  Anxious  Affect:  Blunt  Thought Process:  Coherent and Descriptions of Associations: Intact  Orientation:  Full (Time, Place, and Person)  Thought Content:  WDL and Logical  Suicidal Thoughts:  No  Homicidal Thoughts:  No  Memory:  Immediate;   Good Recent;   Good Remote;   Good  Judgement:  Fair  Insight:  Lacking  Psychomotor Activity:  Normal  Concentration:  Concentration: Good and Attention Span: Good  Recall:  Good  Fund of Knowledge:  Fair  Language:  Good  Akathisia:  No  Handed:  Right  AIMS (if indicated):     Assets:  Housing Leisure Time Physical Health Resilience Social Support  ADL's:  Intact  Cognition:  WNL  Sleep:        Demographic Factors:  Adolescent or young adult  Loss Factors: NA  Historical Factors: Impulsivity  Risk Reduction Factors:   Sense of responsibility to family, Employed, Living with another person, especially a relative and Positive social support  Continued Clinical Symptoms:  Anxiety, mild  Cognitive Features That Contribute To Risk:  None    Suicide Risk:  Minimal: No identifiable suicidal ideation.  Patients presenting with no risk factors but with morbid ruminations; may be classified as minimal risk based on the severity of the depressive symptoms   Plan Of Care/Follow-up recommendations:  Other psychoactive substance abuse with  psychoactive substance-induced mood disorder: -EDP provided Haldol 5 mg IM once -started gabapentin 300 mg TID  Seizure prevention: -EDP ordered Valium 10 mg once Activity:  as tolerated Diet:  heart healthy diet  Disposition: discharge home Nanine MeansJamison Nicandro Perrault, NP 02/09/2019, 4:00 PM

## 2019-02-09 NOTE — ED Notes (Signed)
PT given cell phone at this time to write down phone numbers. Sula Soda at bedside with pt

## 2019-02-09 NOTE — ED Triage Notes (Addendum)
Says she took an injection an d wants narcan. Says she is not sure what they gave her but says it was to do with witchcraft.  She says they are in her house despite that she cleaned it.  She is anxious.

## 2019-02-09 NOTE — ED Notes (Signed)
ED Provider at bedside. 

## 2019-02-09 NOTE — ED Notes (Signed)
This RN received crayon back at this time, pt has folded piece of paper in her room.

## 2019-02-09 NOTE — ED Notes (Signed)
Per order, pt was discharged after receiving and reviewing AVS, discharge instructions, script, and outpatient resources. Pt was encouraged to follow up at [facility]. Crisis planning, including availability of 911 and suicide hotline, was discussed. Pt signed for discharge and indicated she had received all belongings. Pt was calm, cooperative, and in no acute distress when she walked out of the ED waiting room with a steady, even gait.

## 2019-02-09 NOTE — ED Notes (Signed)
Pt is now resting with eyes closed and regular/even/unlabored respirations. "I'm so tired," she said earlier. When asked, she said she'd last slept about 6 hours prior to arrival. She denied SI and HI, and said she had heard voices saying they were going to harm her. "I see things, but they're there." Asked what she sees, "spiritual things." Pt is calm/cooperative. She reports last having a seizure 1-2 years ago that she says "was drug-related." Pt given water, blanket, and oriented to unit protocols (bathroom rules, camera monitoring, etc.). Will continue to monitor for needs/safety.

## 2019-02-09 NOTE — ED Provider Notes (Signed)
[facility] Emergency Department Provider Note       Time seen: ----------------------------------------- 10:25 AM on 02/09/2019 -----------------------------------------   I have reviewed the triage vital signs and the nursing notes.  HISTORY   Chief Complaint Drug Overdose and Psychiatric Evaluation    HPI Rhonda Walsh is a 18 y.o. female with a history of asthma, seizures, substance abuse, depression who presents to the ED for possible ingestion.  Patient states she just started practicing witchcraft and she participated in the ceremony today.  She is not sure what they gave her, feels bad currently.  Pain is 9 out of 10.  Past Medical History:  Diagnosis Date  . Asthma   . Seizures [facility])     Patient Active Problem List   Diagnosis Date Noted  . Polysubstance abuse ([place]) 05/10/2018  . 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 Depressive disorder 05/10/2018  . Pelvic pain in female 05/09/2018    Past Surgical History:  Procedure Laterality Date  . WISDOM TOOTH EXTRACTION      Allergies Patient has no known allergies.  Social History Social History   Tobacco Use  . Smoking status: Former Smoker    Types: Cigarettes, E-cigarettes    Quit date: 04/01/2018    Years since quitting: 0.8  . Smokeless tobacco: Never Used  Substance Use Topics  . Alcohol use: Yes    Alcohol/week: 5.0 standard drinks    Types: 5 Shots of liquor per week  . Drug use: Yes    Types: Cocaine, Marijuana    Comment: percocet/ moley, marijuana everyday    Review of Systems Constitutional: Negative for fever. Cardiovascular: Negative for chest pain. Respiratory: Negative for shortness of breath. Gastrointestinal: Negative for abdominal pain, vomiting and diarrhea. Musculoskeletal: Negative for back pain. Skin: Negative for rash. Neurological: Negative for headaches, focal weakness or  numbness. Psychiatric: Positive for medication ingestion  All systems negative/normal/unremarkable except as stated in the HPI  ____________________________________________   PHYSICAL EXAM:  VITAL SIGNS: ED Triage Vitals [02/09/19 1000]  Enc Vitals Group     BP (!) 143/92     Pulse Rate 99     Resp 20     Temp 99 F (37.2 C)     Temp Source Oral     SpO2 100 %     Weight      Height      Head Circumference      Peak Flow      Pain Score 9     Pain Loc      Pain Edu?      Excl. in [place]?    Constitutional: Alert and oriented.  Anxious, mild distress Eyes: Conjunctivae are normal. Normal extraocular movements. Cardiovascular: Normal rate, regular rhythm. No murmurs, rubs, or gallops. Respiratory: Normal respiratory effort without tachypnea nor retractions. Breath sounds are clear and equal bilaterally. No wheezes/rales/rhonchi. Gastrointestinal: Soft and nontender. Normal bowel sounds Musculoskeletal: Nontender with normal range of motion in extremities. No lower extremity tenderness nor edema. Neurologic:  Normal speech and language. No gross focal neurologic deficits are appreciated.  Skin:  Skin is warm, dry and intact. No rash noted. Psychiatric: Anxious mood and affect, tearful ____________________________________________  ED COURSE:  As part of my medical decision making, I reviewed the following data within the [place] History obtained from family if available, nursing notes, old chart and ekg, as well as notes from prior ED visits. Patient presented for  possible ingestion, we will assess with labs and imaging as indicated at this time.   Procedures  Rhonda Walsh was evaluated in Emergency Department on 02/09/2019 for the symptoms described in the history of present illness. She was evaluated in the context of the global COVID-19 pandemic, which necessitated consideration that the patient might be at risk for infection with the SARS-CoV-2  virus that causes COVID-19. Institutional protocols and algorithms that pertain to the evaluation of patients at risk for COVID-19 are in a state of rapid change based on information released by regulatory bodies including the CDC and federal and state organizations. These policies and algorithms were followed during the patient's care in the ED.  ____________________________________________   LABS (pertinent positives/negatives)  Labs Reviewed  COMPREHENSIVE METABOLIC PANEL - Abnormal; Notable for the following components:      Result Value   Glucose, Bld 103 (*)    Total Protein 9.1 (*)    Total Bilirubin 1.3 (*)    All other components within normal limits  CBC - Abnormal; Notable for the following components:   Hemoglobin 15.3 (*)    All other components within normal limits  URINE DRUG SCREEN, QUALITATIVE ([facility] ONLY) - Abnormal; Notable for the following components:   Amphetamines, Ur Screen POSITIVE (*)    Cocaine Metabolite,Ur [place] POSITIVE (*)    Cannabinoid 50 Ng, Ur [place] POSITIVE (*)    All other components within normal limits  ETHANOL  POC URINE PREG, ED   ___________________________________________   DIFFERENTIAL DIAGNOSIS   Ingestion, medication side effect, substance abuse, psychosis  FINAL ASSESSMENT AND PLAN  Drug-induced psychosis   Plan: The patient had presented for acute psychosis. Patient's labs revealed a drug screen positive for amphetamines, cocaine and marijuana.  Patient was given oral Valium here on arrival.  She may require sedation and IVC.  She is still very agitated and anxious.   Ulice DashJohnathan E Iracema Lanagan, MD    Note: This note was generated in part or whole with voice recognition software. Voice recognition is usually quite accurate but there are transcription errors that can and very often do occur. I apologize for any typographical errors that were not detected and corrected.     Emily FilbertWilliams, Vimal Derego E, MD 02/09/19 1105

## 2019-02-09 NOTE — ED Notes (Signed)
Rhonda Walsh, friend [phone]

## 2019-02-09 NOTE — ED Notes (Signed)
Lab called - pt is COVID positive. [employer] worker notified.

## 2019-02-11 ENCOUNTER — Telehealth: Payer: Self-pay | Admitting: *Deleted

## 2019-02-11 NOTE — Telephone Encounter (Signed)
Pt called requesting her test result of covid-19 emailed to her. Pt was told that we normally mail those results if needed. She voiced understanding and that she needed as soon as possible for work.

## 2019-02-12 NOTE — Telephone Encounter (Signed)
Rec'd request to mail copy of COVID test result to pt.  Attempted to call pt. To confirm that she understands that COVID test was positive, and has received recommendations on when to end isolation.  Unable to contact pt. or leave a message, due voice mailbox being full.  Will mail COVID test results, and a letter explaining CDC guidelines for isolation, when pt. has positive results.  Mailing to home address.

## 2019-09-16 IMAGING — DX DG ANKLE COMPLETE 3+V*L*
3 series · 3 of 3 positions shown · non-contrast
Comparison: None.

CLINICAL DATA: Left ankle pain after tripping over boxes. Swelling.

EXAM:
LEFT ANKLE COMPLETE - 3+ VIEW

[ankle ap]
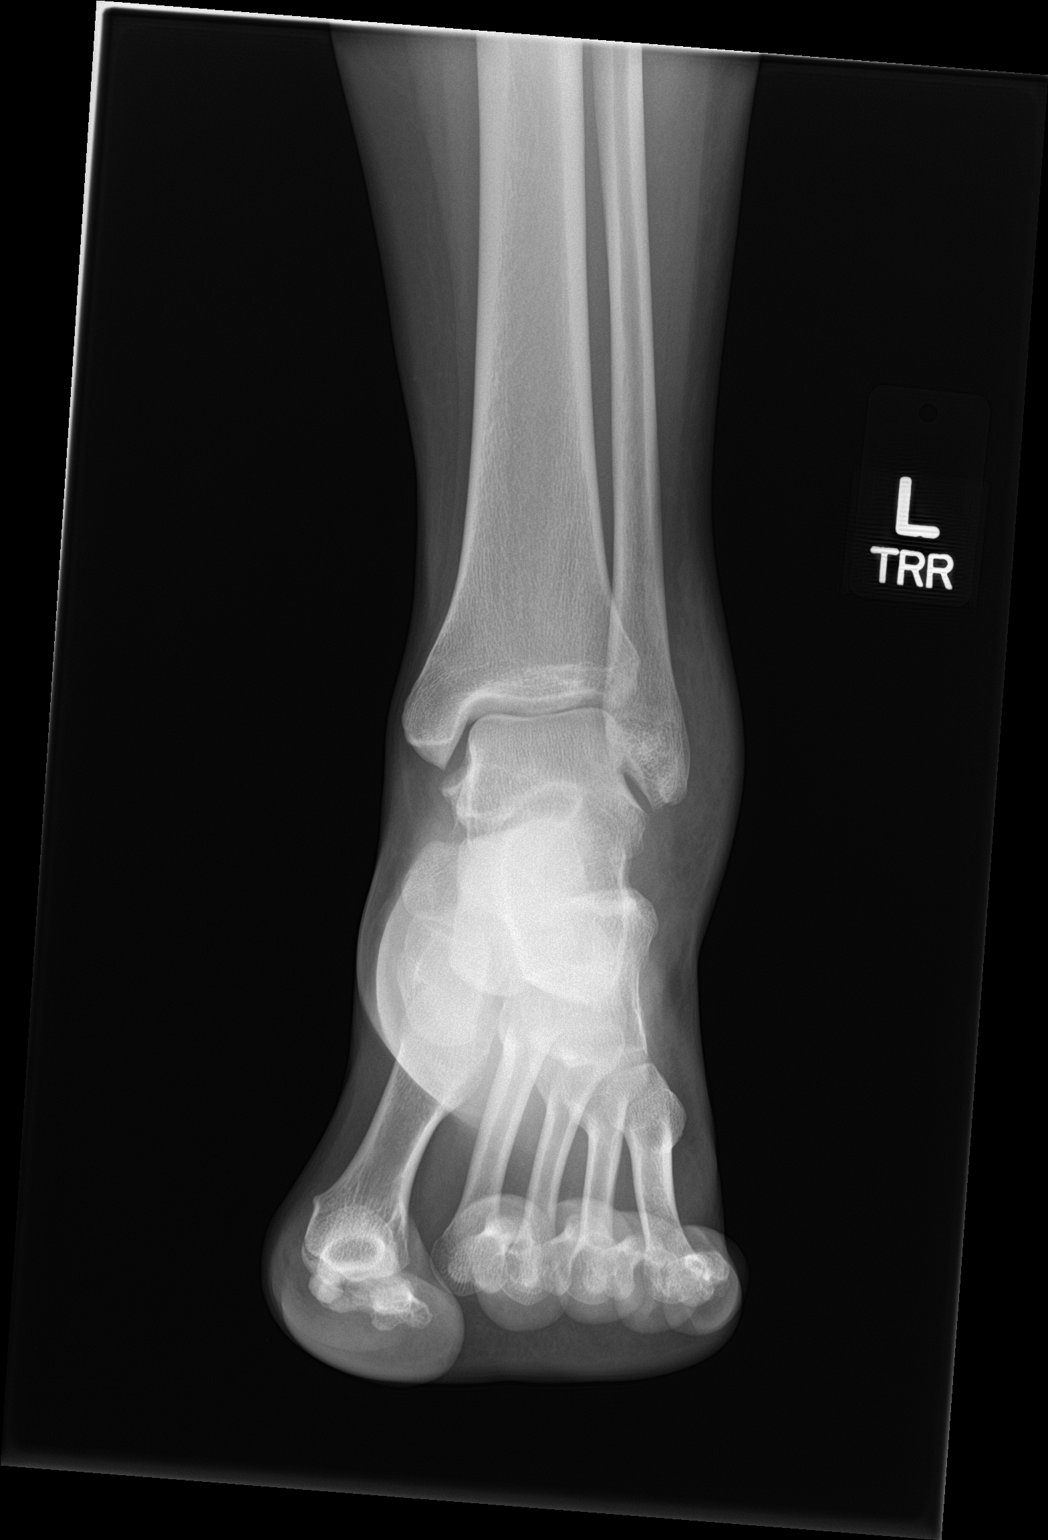

[ankle obl]
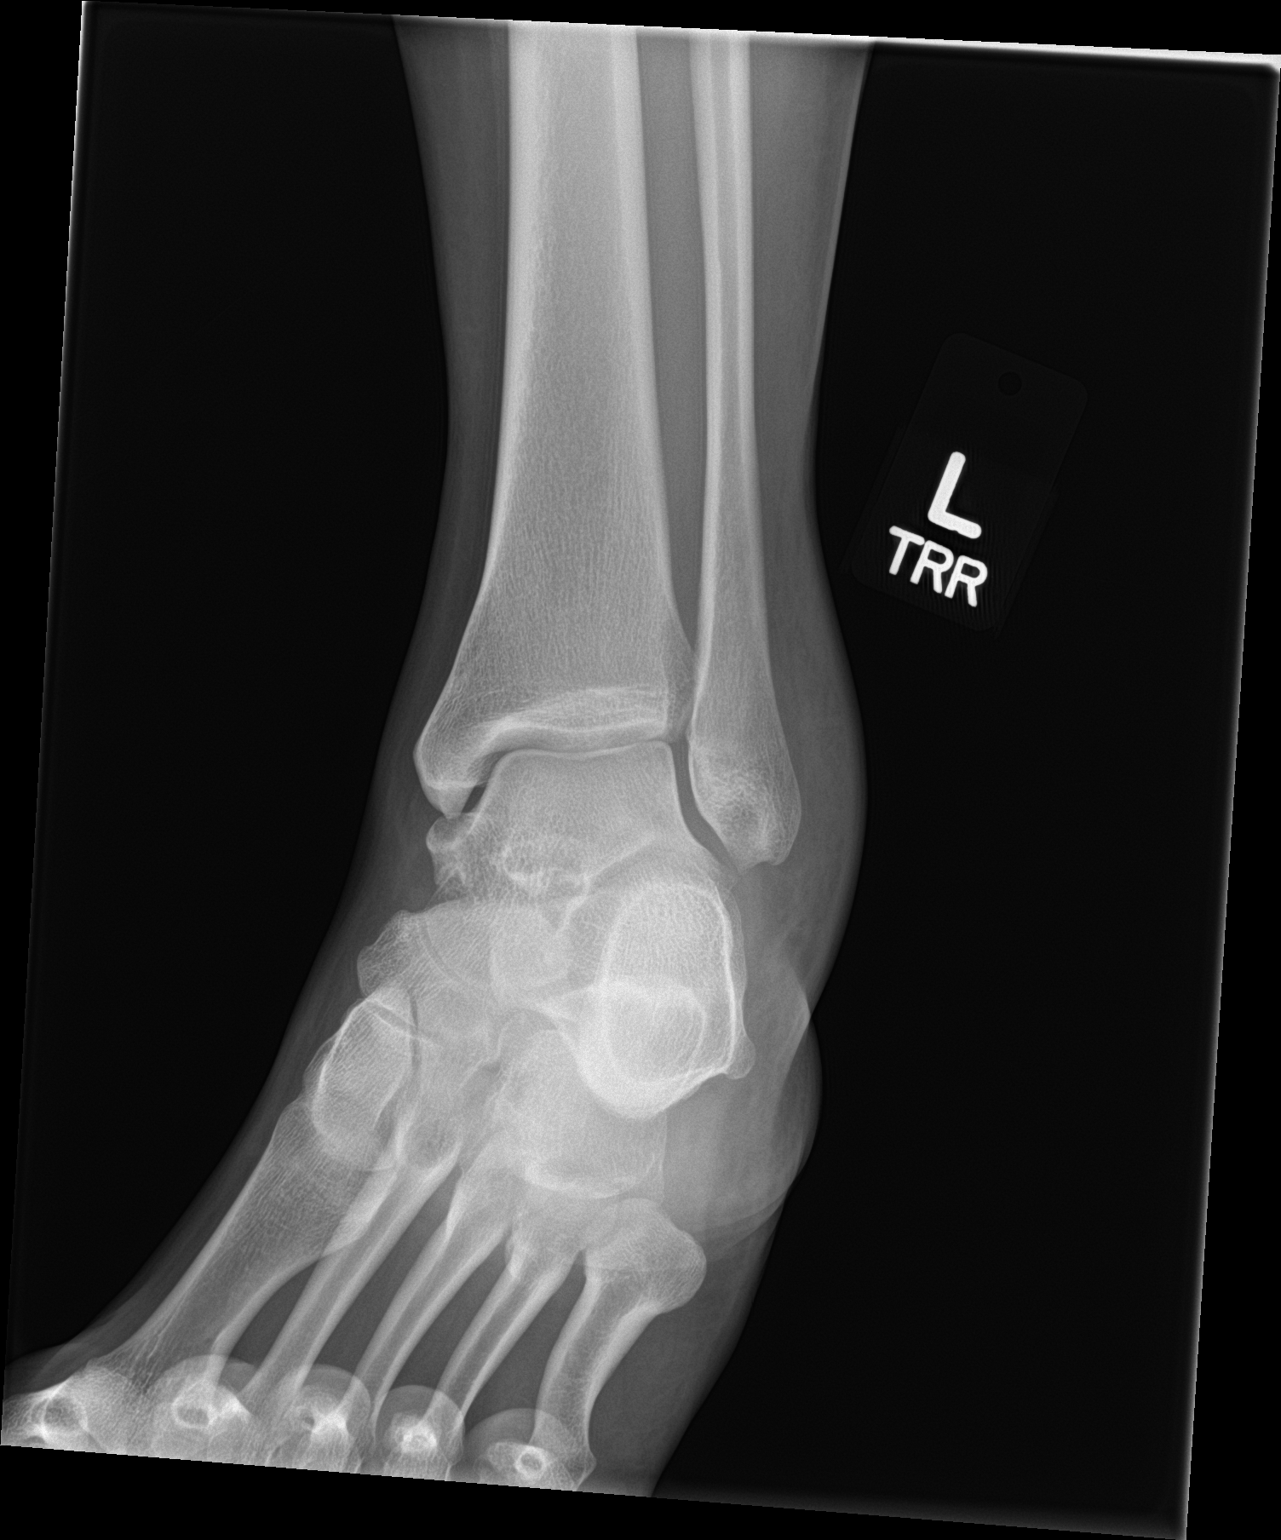

[ankle lat]
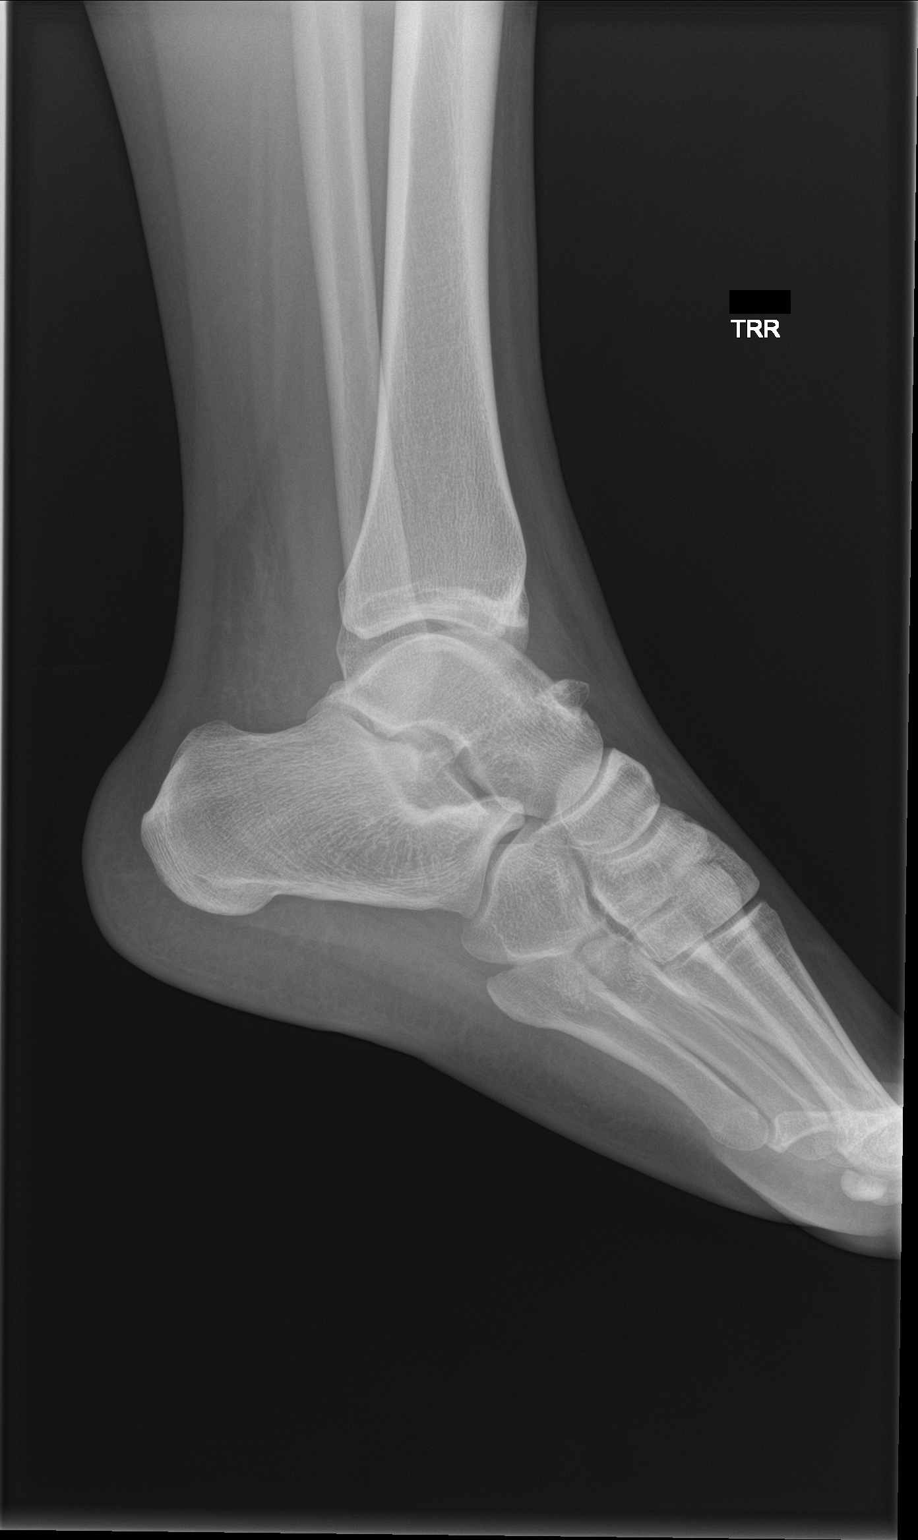

[3 of 3 positions shown; findings below may reference images not displayed]

FINDINGS: Soft tissue swelling about the lateral left ankle. No evidence of
acute fracture or dislocation. No focal bone lesion or bone
destruction. Bone cortex appears intact. Joint spaces are preserved.
IMPRESSION: Soft tissue swelling. No acute bony abnormalities.

## 2021-04-19 ENCOUNTER — Other Ambulatory Visit: Payer: Self-pay

## 2021-04-19 ENCOUNTER — Ambulatory Visit
Admission: EM | Admit: 2021-04-19 | Discharge: 2021-04-19 | Disposition: A | Discharge status: Home / Self Care | Payer: 59 | Attending: Physician Assistant | Admitting: Physician Assistant

## 2021-04-19 ENCOUNTER — Encounter: Payer: Self-pay | Admitting: Licensed Clinical Social Worker

## 2021-04-19 DIAGNOSIS — H9201 Otalgia, right ear: Secondary | ICD-10-CM

## 2021-04-19 DIAGNOSIS — H60501 Unspecified acute noninfective otitis externa, right ear: Secondary | ICD-10-CM

## 2021-04-19 DIAGNOSIS — H6121 Impacted cerumen, right ear: Secondary | ICD-10-CM

## 2021-04-19 MED ORDER — NEOMYCIN-POLYMYXIN-HC 3.5-10000-1 OT SUSP: 4.0000 [drp] | Freq: Three times a day (TID) | OTIC | 0 refills | Status: AC

## 2021-04-19 NOTE — ED Provider Notes (Signed)
[facility]    CSN: [id number] Arrival date & time: 04/19/21  1055      History   Chief Complaint Chief Complaint  Patient presents with   Otalgia    HPI BREZLYN MANRIQUE is a 20 y.o. female presenting for right ear pain and right-sided headache since yesterday.  Pain comes and goes.  Admits to feeling like the hearing is diminished in his ear.  She denies any drainage from the ear.  [place] 1 ear plug in the affected ear.  She does not report any dizziness or vomiting.  No cough, congestion or sore throat.  Has not taken any over-the-counter medication for symptoms.  No other complaints.  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      Home Medications    Prior to Admission medications   Medication Sig Start Date End Date Taking? Authorizing Provider  neomycin-polymyxin-hydrocortisone (CORTISPORIN) 3.5-10000-1 OTIC suspension Place 4 drops into the right ear 3 (three) times daily for 7 days. 04/19/21 04/26/21 Yes Eusebio Friendly B, PA-C  albuterol (PROVENTIL HFA;VENTOLIN HFA) 108 (90 Base) MCG/ACT inhaler Inhale 2 puffs into the lungs every 6 (six) hours as needed for wheezing or shortness of breath. 06/18/18   Enid Derry, PA-C  gabapentin (NEURONTIN) 300 MG capsule Take 1 capsule (300 mg total) by mouth 3 (three) times daily. 02/09/19   Charm Rings, NP  naproxen (NAPROSYN) 500 MG tablet Take 1 tablet (500 mg total) by mouth 2 (two) times daily with a meal. 07/10/18   Triplett, Kasandra Knudsen,  FNP    Family History Family History  Adopted: Yes    Social History Social History   Tobacco Use   Smoking status: Former    Types: Cigarettes, E-cigarettes    Quit date: 04/01/2018    Years since quitting: 3.0   Smokeless tobacco: Never  Vaping Use   Vaping Use: Never used  Substance Use Topics   Alcohol use: Yes    Alcohol/week: 5.0 standard drinks    Types: 5 Shots of liquor per week   Drug use: Yes    Types: Cocaine, Marijuana    Comment: percocet/ moley, marijuana everyday     Allergies   Patient has no known allergies.   Review of Systems Review of Systems  Constitutional:  Negative for chills, diaphoresis, fatigue and fever.  HENT:  Positive for ear pain and hearing loss. Negative for congestion, ear discharge, rhinorrhea, sinus pressure, sinus pain and sore throat.   Respiratory:  Negative for cough.   Gastrointestinal:  Negative for nausea and vomiting.  Musculoskeletal:  Negative for myalgias.  Neurological:  Positive for headaches. Negative for dizziness and weakness.  Hematological:  Negative for adenopathy.    Physical Exam Triage Vital Signs ED Triage Vitals  Enc Vitals Group     BP 04/19/21 1109 139/89     Pulse Rate 04/19/21 1109 86     Resp 04/19/21 1109 16     Temp 04/19/21  1109 98.9 F (37.2 C)     Temp Source 04/19/21 1109 Oral     SpO2 04/19/21 1109 97 %     Weight 04/19/21 1106 215 lb (97.5 kg)     Height 04/19/21 1106 5\' 5"  (1.651 m)     Head Circumference --      Peak Flow --      Pain Score 04/19/21 1106 8     Pain Loc --      Pain Edu? --      Excl. in GC? --    No data found.  Updated Vital Signs BP 139/89 (BP Location: Left Arm)   Pulse 86   Temp 98.9 F (37.2 C) (Oral)   Resp 16   Ht 5\' 5"  (1.651 m)   Wt 215 lb (97.5 kg)   LMP 04/12/2021 (Exact Date)   SpO2 97%   BMI 35.78 kg/m      Physical Exam Vitals and nursing note reviewed.  Constitutional:      General: She is not in acute distress.    Appearance:  Normal appearance. She is not ill-appearing or toxic-appearing.  HENT:     Head: Normocephalic and atraumatic.     Right Ear: External ear normal. There is impacted cerumen.     Left Ear: Tympanic membrane, ear canal and external ear normal.     Nose: Nose normal.     Mouth/Throat:     Mouth: Mucous membranes are moist.     Pharynx: Oropharynx is clear.  Eyes:     General: No scleral icterus.       Right eye: No discharge.        Left eye: No discharge.     Conjunctiva/sclera: Conjunctivae normal.  Cardiovascular:     Rate and Rhythm: Normal rate and regular rhythm.  Pulmonary:     Effort: Pulmonary effort is normal. No respiratory distress.  Musculoskeletal:     Cervical back: Neck supple.  Skin:    General: Skin is dry.  Neurological:     General: No focal deficit present.     Mental Status: She is alert. Mental status is at baseline.     Motor: No weakness.     Gait: Gait normal.  Psychiatric:        Mood and Affect: Mood normal.        Behavior: Behavior normal.        Thought Content: Thought content normal.     UC Treatments / Results  Labs (all labs ordered are listed, but only abnormal results are displayed) Labs Reviewed - No data to display  EKG   Radiology No results found.  Procedures Procedures (including critical care time)  Medications Ordered in UC Medications - No data to display  Initial Impression / Assessment and Plan / UC Course  I have reviewed the triage vital signs and the nursing notes.  Pertinent labs & imaging results that were available during my care of the patient were reviewed by me and considered in my medical decision making (see chart for details).  20 year old female presenting for right ear pain and right-sided headache since yesterday.  Ice is associated with decreased hearing of the ear.  On exam she does have cerumen impaction of right EAC.  Otic lavage ordered to be performed by nursing staff.  Will reexamine ear after  otic lavage to ensure that she has no ear infection and if she does have one, treat appropriately.  After otic lavage performed, moderate  erythema and swelling of the EAC with slight erythema of the TM.  Patient reports ear pain has improved but she continues to have a headache on the right side of her head.  Advised patient she could have a developing otitis externa.  I have sent in Cortisporin.  Advised ibuprofen and Tylenol for the headache and to let us know if symptoms persist in the next couple of days.  May consider treatment with Augmentin for possible otitis media since she does have some erythema of the TM.  Final Clinical Impressions(s) / UC Diagnoses   Final diagnoses:  Impacted cerumen of right ear  Right ear pain  Acute otitis externa of right ear, unspecified type     Discharge Instructions      -Earwax has been removed from the ear.  Please follow back up with [place] if you have more ear pain or drainage.  I have sent an antibiotic eardrop that has a steroid in it to help with inflammation and also if there is any infection. -Take Tylenol and/or Motrin as needed for the headache.     ED Prescriptions     Medication Sig Dispense Auth. Provider   neomycin-polymyxin-hydrocortisone (CORTISPORIN) 3.5-10000-1 OTIC suspension Place 4 drops into the right ear 3 (three) times daily for 7 days. 10 mL Shirlee Latch, PA-C      PDMP not reviewed this encounter.   Shirlee Latch, PA-C 04/19/21 1212

## 2021-04-19 NOTE — ED Triage Notes (Signed)
Ear pain  and pressure in right ear x 1 day

## 2021-04-19 NOTE — Discharge Instructions (Addendum)
-  Earwax has been removed from the ear.  Please follow back up with [place] if you have more ear pain or drainage.  I have sent an antibiotic eardrop that has a steroid in it to help with inflammation and also if there is any infection. -Take Tylenol and/or Motrin as needed for the headache.

## 2024-02-13 ENCOUNTER — Ambulatory Visit: Payer: Self-pay
